# Patient Record
Sex: Female | Born: 1950 | Race: White | Hispanic: Yes | Marital: Single
Health system: Southern US, Community
[De-identification: ages and names within clinical notes are randomized; demographics above are authoritative.]

## PROBLEM LIST (undated history)

## (undated) DIAGNOSIS — J302 Other seasonal allergic rhinitis: Secondary | ICD-10-CM

## (undated) DIAGNOSIS — I10 Essential (primary) hypertension: Secondary | ICD-10-CM

## (undated) DIAGNOSIS — J45909 Unspecified asthma, uncomplicated: Secondary | ICD-10-CM

## (undated) HISTORY — DX: Unspecified asthma, uncomplicated: J45.909

## (undated) HISTORY — DX: Essential (primary) hypertension: I10

## (undated) HISTORY — DX: Other seasonal allergic rhinitis: J30.2

---

## 2000-06-01 HISTORY — PX: VAGINAL HYSTERECTOMY: SUR661

## 2017-03-16 ENCOUNTER — Ambulatory Visit (INDEPENDENT_AMBULATORY_CARE_PROVIDER_SITE_OTHER): Payer: Medicare Other | Admitting: Pulmonary Disease

## 2017-03-16 ENCOUNTER — Encounter: Payer: Self-pay | Admitting: Pulmonary Disease

## 2017-03-16 ENCOUNTER — Ambulatory Visit (INDEPENDENT_AMBULATORY_CARE_PROVIDER_SITE_OTHER)
Admission: RE | Admit: 2017-03-16 | Discharge: 2017-03-16 | Disposition: A | Discharge status: Home / Self Care | Payer: Medicare Other | Source: Ambulatory Visit | Attending: Pulmonary Disease | Admitting: Pulmonary Disease

## 2017-03-16 VITALS — BP 126/68 | HR 76 | Ht 62.0 in | Wt 142.0 lb

## 2017-03-16 DIAGNOSIS — R062 Wheezing: Secondary | ICD-10-CM

## 2017-03-16 DIAGNOSIS — R0600 Dyspnea, unspecified: Secondary | ICD-10-CM

## 2017-03-16 LAB — NITRIC OXIDE: NITRIC OXIDE: 77

## 2017-03-16 MED ORDER — ALBUTEROL SULFATE HFA 108 (90 BASE) MCG/ACT IN AERS: 2.0000 | INHALATION_SPRAY | Freq: Four times a day (QID) | RESPIRATORY_TRACT | 6 refills | Status: AC | PRN

## 2017-03-16 NOTE — Addendum Note (Signed)
Addended by: Velvet Bathe on: 03/16/2017 11:53 AM   Modules accepted: Orders

## 2017-03-16 NOTE — Addendum Note (Signed)
Addended by: Velvet Bathe on: 03/16/2017 11:45 AM   Modules accepted: Orders

## 2017-03-16 NOTE — Progress Notes (Signed)
Subjective:    Patient ID: Gina Thomas, female    DOB: 1951-04-12, 66 y.o.   MRN: [id number]  Synopsis: Referred in 2018 for evaluation of recurrent bronchitis and asthma.  HPI Chief Complaint  Patient presents with  . Advice Only    self referral for bronchitis, asthma.  pt recently relocated from [facility].     Gina Thomas is here to see me.  She says that she has had "hay fever" her whole life since childhood.  She doesn't know if she had asthma back then.  Never hospitalized for a respiratory problem.  She says that this got better as she aged however.  9 years ago she had to go to Urgent care because she had a bad cold that turned into bronchitis and then later she had asthma afterwards.  She was treated with albuterol, a cough medicine and prednisone initially.  She said that she was fine after that for 9 years until January 2018.  In January she had similar symptoms with a cold, acute bronchitis.  She had shortness of breath, cough, and wheezing.  She had a dry cough then.  She went to urgent care and took albuterol alone (no prednisone) and she got better over 6 weeks.    From 1977 to 1995 her husband smoked quite a bit.    On December 30, 2016 she moved to [place] from [place].  1 week after arrival she "got super sick": coughing, wheezing, shortness of breath.  In the beginning of September she went to Urgent Care and was seen by an NP and was Rx'd albuterol, a cough syrup, azithromycin, and prednisone  daily for 5 days.   She says that with this regimen she only slightly improved but she continues to struggle with coughing, wheezing and shortness of breath.  Her symptoms are periodic and she will have periods of normalcy.  Since her arrival here she doesn't have sinus congestion or itchy eyes.  She coughs up clear phlegm.  No heartburn or indigestion.    She has worked in a clerical environment over the years.  She now lives in a home she is renting, they have a cat which  they've had for 10 years.  The home has carpet, the HVAC system appears dirty.  She doesn't know if the house has mold.    She denies a scratchy throat, no trouble with her throat.     Past Medical History:  Diagnosis Date  . Asthma   . Hypertension   . Seasonal allergies      History reviewed. No pertinent family history.   Social History   Social History  . Marital status: Single    Spouse name: N/A  . Number of children: N/A  . Years of education: N/A   Occupational History  . Not on file.   Social History Main Topics  . Smoking status: Passive Smoke Exposure - Never Smoker  . Smokeless tobacco: Never Used     Comment: husband smoked in home from 1977-1995  . Alcohol use No  . Drug use: Unknown  . Sexual activity: Not on file   Other Topics Concern  . Not on file   Social History Narrative  . No narrative on file     No Known Allergies   No outpatient prescriptions prior to visit.   No facility-administered medications prior to visit.       Review of Systems  Constitutional: Negative for fever and unexpected weight change.  HENT:  Negative for congestion, dental problem, ear pain, nosebleeds, postnasal drip, rhinorrhea, sinus pressure, sneezing, sore throat and trouble swallowing.   Eyes: Negative for redness and itching.  Respiratory: Positive for cough, chest tightness, shortness of breath and wheezing.   Cardiovascular: Negative for palpitations and leg swelling.  Gastrointestinal: Negative for nausea and vomiting.  Genitourinary: Negative for dysuria.  Musculoskeletal: Negative for joint swelling.  Skin: Negative for rash.  Neurological: Negative for headaches.  Hematological: Does not bruise/bleed easily.  Psychiatric/Behavioral: Negative for dysphoric mood. The patient is not nervous/anxious.        Objective:   Physical Exam Vitals:   03/16/17 1055  BP: 126/68  Pulse: 76  SpO2: 97%  Weight: 142 lb (64.4 kg)  Height:  (1.575 m)    Gen: well appearing, no acute distress HENT: NCAT, OP clear, neck supple without masses Eyes: PERRL, EOMi Lymph: no cervical lymphadenopathy PULM: Wheezing bilaterally B CV: RRR, no mgr, no JVD GI: BS+, soft, nontender, no hsm Derm: no rash or skin breakdown MSK: normal bulk and tone Neuro: A&Ox4, CN II-XII intact, strength 5/5 in all 4 extremities Psyche: normal mood and affect        Assessment & Plan:   Dyspnea, unspecified type - Plan: DG Chest 2 View, Spirometry with Graph, Nitric oxide  Wheezing  Discussion: He describes several weeks of chest tightness, wheezing, and shortness of breath. On physical exam she has a significant amount of wheezing today. Given the intermittent episodes she has experienced over the years and the persistence of these symptoms since relocating to our area I believe that she has allergic asthma. To confirm this I would like to get spirometry testing, exhaled nitric oxide testing and a chest x-ray today. In terms of controller medicines she is taking Singulair but she still has poorly controlled asthma symptoms so I have advised at length for her to consider an inhaled corticosteroid. However, it's clear to me that she has a predetermined bias against these medicines and there is nothing I can do to convince her to take it. I actually tried for quite some time to convince her to try to take them and counseled her on the relative low risk of side effects from inhaled corticosteroids but she still refuses to take them. Given this I think is very reasonable to refer her to an allergist to see if there is an allergy that can be treated with immunotherapy.  Plan: For wheezing and shortness of breath: I believe that you have asthma We will arrange for a spirometry test, exhaled nitric oxide test and a chest X-ray Keep taking singulair Use albuterol 2 puffs every 4-6 hours as needed for chest tightness, wheezing or shortness of breath As you are not  comfortable taking an inhaled corticosteroid and I don't think that cromolyn is a very good medicine, I'd like for you to see an allergist to assess for immunotherapy  We will see you back in 3-4 weeks with an NP or sooner if needed    Current Outpatient Prescriptions:  .  Alpha-Lipoic Acid 200 MG CAPS, Take 1 capsule by mouth daily., Disp: , Rfl:  .  Ascorbic Acid (VITAMIN C) 1000 MG tablet, Take 1,000 mg by mouth daily., Disp: , Rfl:  .  Cinnamon 500 MG capsule, Take 500 mg by mouth daily., Disp: , Rfl:  .  hydrochlorothiazide (MICROZIDE) 12.5 MG capsule, Take 12.5 mg by mouth daily., Disp: , Rfl:  .  Lecithin 1200 MG CAPS, Take 2 capsules  by mouth daily., Disp: , Rfl:  .  losartan (COZAAR) 50 MG tablet, Take 50 mg by mouth daily., Disp: , Rfl:  .  Misc Natural Products (OSTEO BI-FLEX/5-LOXIN ADVANCED PO), Take 1 capsule by mouth daily., Disp: , Rfl:  .  montelukast (SINGULAIR) 10 MG tablet, Take 10 mg by mouth at bedtime., Disp: , Rfl:  .  Multiple Vitamin (MULTIVITAMIN WITH MINERALS) TABS tablet, Take 1 tablet by mouth daily., Disp: , Rfl:  .  NON FORMULARY, Take 1,500 mg by mouth daily. Kyolic Aged Garlic Extract, Disp: , Rfl:  .  Omega 3-6-9 Fatty Acids (OMEGA 3-6-9 COMPLEX PO), Take 1,600 mg by mouth daily., Disp: , Rfl:  .  rosuvastatin (CRESTOR) 10 MG tablet, Take 10 mg by mouth daily. Alternates 1 tab and 0.5 tab daily., Disp: , Rfl:  .  Turmeric 500 MG CAPS, Take 1 capsule by mouth daily., Disp: , Rfl:  .  Ubiquinol 200 MG CAPS, Take 1 capsule by mouth daily., Disp: , Rfl:

## 2017-03-16 NOTE — Progress Notes (Signed)
   Subjective:    Patient ID: Gina Thomas, female    DOB: 03-25-1951, 66 y.o.   MRN: [id number]  HPI    Review of Systems     Objective:   Physical Exam        Assessment & Plan:

## 2017-03-16 NOTE — Patient Instructions (Signed)
For wheezing and shortness of breath: I believe that you have asthma We will arrange for a spirometry test, exhaled nitric oxide test and a chest X-ray Keep taking singulair Use albuterol 2 puffs every 4-6 hours as needed for chest tightness, wheezing or shortness of breath As you are not comfortable taking an inhaled corticosteroid and I don't think that cromolyn is a very good medicine, I'd like for you to see an allergist to assess for immunotherapy  We will see you back in 3-4 weeks with an NP or sooner if needed

## 2017-03-29 ENCOUNTER — Telehealth: Payer: Self-pay | Admitting: Pulmonary Disease

## 2017-03-29 MED ORDER — BECLOMETHASONE DIPROPIONATE 80 MCG/ACT IN AERS: 2.0000 | INHALATION_SPRAY | Freq: Two times a day (BID) | RESPIRATORY_TRACT | 6 refills | Status: DC

## 2017-03-29 MED ORDER — FLUTICASONE FUROATE 100 MCG/ACT IN AEPB: 1.0000 | INHALATION_SPRAY | Freq: Every day | RESPIRATORY_TRACT | 5 refills | Status: AC

## 2017-03-29 NOTE — Telephone Encounter (Signed)
Pt is aware of BQ's recommendations and voiced her understanding. Rx has been sent to preferred pharmacy. Nothing further needed.  

## 2017-03-29 NOTE — Telephone Encounter (Signed)
Per BQ: Please send in Arnuity 100.    Spoke with pt, aware of rx change.  rx sent to preferred pharmacy.  Med list has been updated to reflect change.  Nothing further needed.

## 2017-03-29 NOTE — Telephone Encounter (Signed)
Spoke with pt. States that she is now wanting to take an inhaler. At her last OV, she was refusing any type of "steroid" inhaler. Reports that she has been having increased symptoms with only using Albuterol HFA.  BQ - please advise. Thanks.

## 2017-03-29 NOTE — Telephone Encounter (Signed)
Arnuity 1 puff daily

## 2017-03-29 NOTE — Telephone Encounter (Signed)
Pt states Qvar is not covered by insurance, preferred medications are Arnuity and Flovent.  BQ please advise. Thanks.

## 2017-03-29 NOTE — Telephone Encounter (Signed)
Can you clarify dose?

## 2017-03-29 NOTE — Telephone Encounter (Signed)
Pt called back about the status of this message..pt states she is coughing more, pt contact # [phone]

## 2017-03-29 NOTE — Telephone Encounter (Signed)
QVar 80mcg 2 puff bid 

## 2017-04-04 ENCOUNTER — Encounter: Payer: Self-pay | Admitting: Pulmonary Disease

## 2017-04-05 ENCOUNTER — Telehealth: Payer: Self-pay | Admitting: Pulmonary Disease

## 2017-04-05 NOTE — Telephone Encounter (Signed)
This appointment has been rescheduled.  It was cancelled by the automated reminder system but this has been rescheduled and patient is aware.

## 2017-04-05 NOTE — Telephone Encounter (Signed)
lmtcb x1 for pt. 

## 2017-04-05 NOTE — Telephone Encounter (Signed)
Patient called back - she can be reached at [phone] -pr

## 2017-04-05 NOTE — Telephone Encounter (Signed)
Pt is aware of BQ's recommendation's and voiced her understanding.  Nothing further needed.    

## 2017-04-05 NOTE — Telephone Encounter (Signed)
Hold Arnuity until tomorrow's appointment Use ventolin prn Keep appointment tomorrow

## 2017-04-05 NOTE — Telephone Encounter (Signed)
Pt was prescribed Arnuity 100 on 03/29/17. Pt states the first two day she took this medication she did not develop any symptoms. Pt states last night, minutes after she took Arnuity she was unable to caught her breath. Pt states symptoms subsided after using Ventolin. Pt is wanting to know if bronchodilator dries up mucus or if Flovent would have the same side effects.  Pt is scheduled for OV with TP tomorrow at 10:45.  BQ please advise. Thanks.

## 2017-04-06 ENCOUNTER — Ambulatory Visit: Payer: Medicare Other | Admitting: Adult Health

## 2017-04-06 ENCOUNTER — Other Ambulatory Visit: Payer: Self-pay | Admitting: Pulmonary Disease

## 2017-04-06 ENCOUNTER — Ambulatory Visit (INDEPENDENT_AMBULATORY_CARE_PROVIDER_SITE_OTHER): Payer: Medicare Other | Admitting: Pulmonary Disease

## 2017-04-06 ENCOUNTER — Encounter: Payer: Self-pay | Admitting: Adult Health

## 2017-04-06 VITALS — BP 106/64 | HR 72 | Ht 62.0 in | Wt 143.4 lb

## 2017-04-06 DIAGNOSIS — R059 Cough, unspecified: Secondary | ICD-10-CM

## 2017-04-06 DIAGNOSIS — R0600 Dyspnea, unspecified: Secondary | ICD-10-CM

## 2017-04-06 DIAGNOSIS — R05 Cough: Secondary | ICD-10-CM | POA: Diagnosis not present

## 2017-04-06 DIAGNOSIS — R0602 Shortness of breath: Secondary | ICD-10-CM

## 2017-04-06 DIAGNOSIS — J4540 Moderate persistent asthma, uncomplicated: Secondary | ICD-10-CM

## 2017-04-06 LAB — PULMONARY FUNCTION TEST
FEF 25-75 PRE: 1 L/s
FEF2575-%Pred-Pre: 50 %
FEV1-%PRED-PRE: 53 %
FEV1-Pre: 1.18 L
FEV1FVC-%PRED-PRE: 102 %
FEV6-%Pred-Pre: 53 %
FEV6-Pre: 1.49 L
FEV6FVC-%PRED-PRE: 104 %
FVC-%Pred-Pre: 51 %
FVC-Pre: 1.49 L
PRE FEV1/FVC RATIO: 79 %
Pre FEV6/FVC Ratio: 100 %

## 2017-04-06 MED ORDER — PREDNISONE 10 MG PO TABS: ORAL_TABLET | ORAL | 0 refills | Status: AC

## 2017-04-06 MED ORDER — FLUTICASONE PROPIONATE HFA 110 MCG/ACT IN AERO: 2.0000 | INHALATION_SPRAY | Freq: Two times a day (BID) | RESPIRATORY_TRACT | 3 refills | Status: AC

## 2017-04-06 NOTE — Patient Instructions (Signed)
Begin Flovent 2 puffs Twice daily  , rinse after use.  Prednisone taper over next week  Mucinex DM Twice daily  As needed  Cough/congestion .  Establish with Pulmonary MD once [place] are [place]  Please contact office for sooner follow up if symptoms do not improve or worsen or seek emergency care

## 2017-04-06 NOTE — Progress Notes (Signed)
 @Patient  ID: Gina Thomas, female    DOB: 02/17/1951, 66 y.o.   MRN: [id number]  Chief Complaint  Patient presents with  . Follow-up    Referring provider: No ref. provider found  HPI: 66 yo female seen for pulmonary consult 03/2017 for dyspnea and suspected asthma   TEST  FENO 77 03/2017   04/06/2017 Follow up : Asthma  Pt returns for 3 week follow up . She was seen 03/16/17 for pulmonary consult for dyspnea and suspected. Asthma. Says she had some allergies and hay fever over the years but moved from [place] to [place] in August and since then has had trouble with cough , wheezing , chest tightness , and dyspnea. She was recommended to begin QVAR but insurance would not cover. She was started on ARNUITY last ov . Her exhaled nitric oxide test was high at 77. CXR showed bronchitic changes with coarse interstitial markings .  Spirometry today showed moderate restriction with no significant obstruction . She says she was not able to tolerate ARNUITY . It caused her to cough more. Felt the powder aggravated her throat. She continues to have some cough and intermittent wheezing . She is moving back to [place] next week. Denies chest pain , orthopnea, edema or fever.  Insurance will not cover QVAR .  Did not tolerate ARNUNITY . - made her worse. Powder did not like it.    No Known Allergies   There is no immunization history on file for this patient.  Past Medical History:  Diagnosis Date  . Asthma   . Hypertension   . Seasonal allergies     Tobacco History: Social History   Tobacco Use  Smoking Status Passive Smoke Exposure - Never Smoker  Smokeless Tobacco Never Used  Tobacco Comment   husband smoked in home from 1977-1995   Counseling given: Not Answered Comment: husband smoked in home from 1977-1995   Outpatient Encounter Medications as of 04/06/2017  Medication Sig  . albuterol (PROVENTIL HFA;VENTOLIN HFA) 108 (90 Base) MCG/ACT inhaler Inhale 2 puffs into the  lungs every 6 (six) hours as needed for wheezing or shortness of breath.  . Alpha-Lipoic Acid 200 MG CAPS Take 1 capsule by mouth daily.  . Ascorbic Acid (VITAMIN C) 1000 MG tablet Take 1,000 mg by mouth daily.  . Cinnamon 500 MG capsule Take 500 mg by mouth daily.  . Fluticasone Furoate (ARNUITY ELLIPTA) 100 MCG/ACT AEPB Inhale 1 puff into the lungs daily.  . hydrochlorothiazide (MICROZIDE) 12.5 MG capsule Take 12.5 mg by mouth daily.  . Lecithin 1200 MG CAPS Take 2 capsules by mouth daily.  Marland Kitchen. losartan (COZAAR) 50 MG tablet Take 50 mg by mouth daily.  . Misc Natural Products (OSTEO BI-FLEX/5-LOXIN ADVANCED PO) Take 1 capsule by mouth daily.  . montelukast (SINGULAIR) 10 MG tablet Take 10 mg by mouth at bedtime.  . Multiple Vitamin (MULTIVITAMIN WITH MINERALS) TABS tablet Take 1 tablet by mouth daily.  . NON FORMULARY Take 1,500 mg by mouth daily. Kyolic Aged Garlic Extract  . Omega 3-6-9 Fatty Acids (OMEGA 3-6-9 COMPLEX PO) Take 1,600 mg by mouth daily.  . rosuvastatin (CRESTOR) 10 MG tablet Take 10 mg by mouth daily. Alternates 1 tab and 0.5 tab daily.  . Turmeric 500 MG CAPS Take 1 capsule by mouth daily.  Marland Kitchen. Ubiquinol 200 MG CAPS Take 1 capsule by mouth daily.   No facility-administered encounter medications on file as of 04/06/2017.      Review of Systems  Constitutional:   No  weight loss, night sweats,  Fevers, chills, fatigue, or  lassitude.  HEENT:   No headaches,  Difficulty swallowing,  Tooth/dental problems, or  Sore throat,                No sneezing, itching, ear ache,  +nasal congestion, post nasal drip,   CV:  No chest pain,  Orthopnea, PND, swelling in lower extremities, anasarca, dizziness, palpitations, syncope.   GI  No heartburn, indigestion, abdominal pain, nausea, vomiting, diarrhea, change in bowel habits, loss of appetite, bloody stools.   Resp: .  No chest wall deformity  Skin: no rash or lesions.  GU: no dysuria, change in color of urine, no urgency or  frequency.  No flank pain, no hematuria   MS:  No joint pain or swelling.  No decreased range of motion.  No back pain.    Physical Exam    GEN: A/Ox3; pleasant , NAD, elderly    HEENT:  [place]/AT,  EACs-clear, TMs-wnl, NOSE-clear, THROAT-clear, no lesions, no postnasal drip or exudate noted.   NECK:  Supple w/ fair ROM; no JVD; normal carotid impulses w/o bruits; no thyromegaly or nodules palpated; no lymphadenopathy.    RESP  Few trace exp wheezes ,. no accessory muscle use, no dullness to percussion  CARD:  RRR, no m/r/g, no peripheral edema, pulses intact, no cyanosis or clubbing.  GI:   Soft & nt; nml bowel sounds; no organomegaly or masses detected.   Musco: Warm bil, no deformities or joint swelling noted.   Neuro: alert, no focal deficits noted.    Skin: Warm, no lesions or rashes    Lab Results:  CBC No results found for: WBC, RBC, HGB, HCT, PLT, MCV, MCH, MCHC, RDW, LYMPHSABS, MONOABS, EOSABS, BASOSABS  BMET No results found for: NA, K, CL, CO2, GLUCOSE, BUN, CREATININE, CALCIUM, GFRNONAA, GFRAA  BNP No results found for: BNP  ProBNP No results found for: PROBNP  Imaging: Dg Chest 2 View  Result Date: 03/16/2017 CLINICAL DATA:  Cough, shortness of breath, and wheezing for the past 2 months. History of asthma, passive smoke exposure. EXAM: CHEST  2 VIEW COMPARISON:  None in PACs FINDINGS: The lungs are adequately inflated. The interstitial markings are coarse. There is no alveolar infiltrate or pleural effusion. The heart and pulmonary vascularity are normal. The bony thorax exhibits no acute abnormality. IMPRESSION: Prominence of the interstitial markings bilaterally compatible with the history of reactive airway disease. One cannot exclude superimposed acute bronchitis. There is no pneumonia nor CHF. Electronically Signed   By: David  [place]azil[place]Jordan M.D.   On: 03/16/2017 14:13     Assessment & Plan:   No problem-specific Assessment & Plan notes found for this  encounter.     Rubye Oaksammy Grainne Knights, NP 04/06/2017

## 2017-04-06 NOTE — Progress Notes (Signed)
Spirometry done today. 

## 2017-04-07 DIAGNOSIS — J45909 Unspecified asthma, uncomplicated: Secondary | ICD-10-CM | POA: Insufficient documentation

## 2017-04-07 DIAGNOSIS — R0600 Dyspnea, unspecified: Secondary | ICD-10-CM | POA: Insufficient documentation

## 2017-04-07 NOTE — Assessment & Plan Note (Signed)
Dyspnea suspect is secondary to poorly controlled Asthma CXR shows increased interstial markings and spirometry shows restriction  Check HRCT chest to r/o ILD

## 2017-04-07 NOTE — Assessment & Plan Note (Signed)
Moderate Persistent Asthma - uncontrolled Intolerant to ARNUITY . Would like to change to Symbicort but she is very resistant to meds .  She does agree to try Flovent which she says is covered by her insurance  Brief steroid taper x 1   Plan   Patient Instructions  Begin Flovent 2 puffs Twice daily  , rinse after use.  Prednisone taper over next week  Mucinex DM Twice daily  As needed  Cough/congestion .  Establish with Pulmonary MD once you are moved [place]  Please contact office for sooner follow up if symptoms do not improve or worsen or seek emergency care

## 2017-04-08 ENCOUNTER — Telehealth: Payer: Self-pay | Admitting: Pulmonary Disease

## 2017-04-08 NOTE — Telephone Encounter (Signed)
High dose OK

## 2017-04-08 NOTE — Telephone Encounter (Addendum)
BQ please advise if it is ok to recieve flu shot with her condition and advise whether it should be the high dose.

## 2017-04-08 NOTE — Telephone Encounter (Signed)
Called pt and advised message from the provider. Pt understood and verbalized understanding. Nothing further is needed.    

## 2017-04-08 NOTE — Progress Notes (Signed)
Reviewed, agree 

## 2017-04-08 NOTE — Telephone Encounter (Signed)
lmtcb x1 for pt. 

## 2017-04-08 NOTE — Telephone Encounter (Signed)
Patient returned call, CB is [phone].

## 2017-04-09 ENCOUNTER — Ambulatory Visit (INDEPENDENT_AMBULATORY_CARE_PROVIDER_SITE_OTHER)
Admission: RE | Admit: 2017-04-09 | Discharge: 2017-04-09 | Disposition: A | Discharge status: Home / Self Care | Payer: Medicare Other | Source: Ambulatory Visit | Attending: Adult Health | Admitting: Adult Health

## 2017-04-09 DIAGNOSIS — R059 Cough, unspecified: Secondary | ICD-10-CM

## 2017-04-09 DIAGNOSIS — R0602 Shortness of breath: Secondary | ICD-10-CM

## 2017-04-09 DIAGNOSIS — R05 Cough: Secondary | ICD-10-CM | POA: Diagnosis not present

## 2017-04-12 ENCOUNTER — Telehealth: Payer: Self-pay | Admitting: Pulmonary Disease

## 2017-04-12 NOTE — Telephone Encounter (Signed)
Notes recorded by Melvenia Needles, NP on 04/09/2017 at 5:36 PM EST CT chest some inflammatory changes in lung ? Chronic asthma changes vs reaction to something  Will need lab work for further evaluation - would be glad to set up for labs here  She is moving to [place] on 04/12/17 , will need to get copy of CT and records to establish with Pulmonary once in [place] for further evaluation if unable to complete here  If she wants to get labs : would get HSP hypersentivity panel , ANA, RA , ESR , IgE , RAST , and CBC w/ Diff.   Please contact office for sooner follow up if symptoms do not improve or worsen or seek emergency care     Advised pt of results. She wants to know if she can use her Flovent 1 puff twice a day?  She states she is doing much better and her last day of Prednisone is tomorrow. She states she will transfer her records to [place] once she sees a Technical brewer.   Give pt number for Medical records [phone], after TP answers.

## 2017-04-12 NOTE — Telephone Encounter (Signed)
Yes this was suppose to be sent to pharmacy  Flovent #1 2 puffs Twice daily  , 5 refills.   So glad to hear she is doing much better.   Is she getting labs here ?    Please contact office for sooner follow up if symptoms do not improve or worsen or seek emergency care

## 2017-04-12 NOTE — Telephone Encounter (Signed)
Called pt and advised message from the provider. Pt understood and verbalized understanding. Nothing further is needed.    

## 2018-09-03 IMAGING — CT CT CHEST HIGH RESOLUTION W/O CM
2 of 5 series · 15 of 36 positions shown, 18 images · non-contrast
Comparison: 03/16/2017 chest radiograph.

CLINICAL DATA: Cough, dyspnea.

EXAM:
CT CHEST WITHOUT CONTRAST
TECHNIQUE: Multidetector CT imaging of the chest was performed following the
standard protocol without intravenous contrast. High resolution
imaging of the lungs, as well as inspiratory and expiratory imaging,
was performed.

[Series 2: high resolution · axial · 0.68mm/px · z∈[-309,-33]mm · 12 of 152 slices shown, 15 images]
[im 7/152  mediastinal]
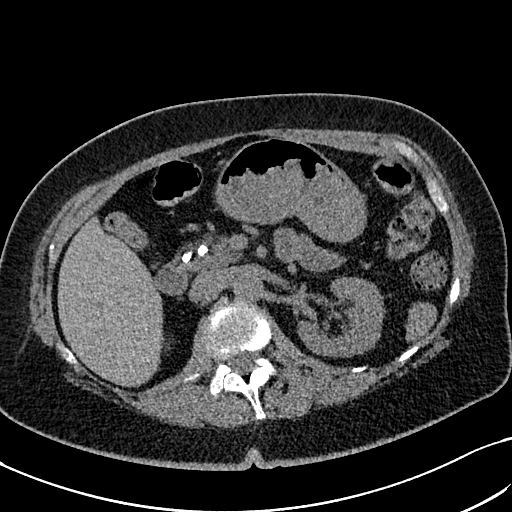
[im 7/152  lung]
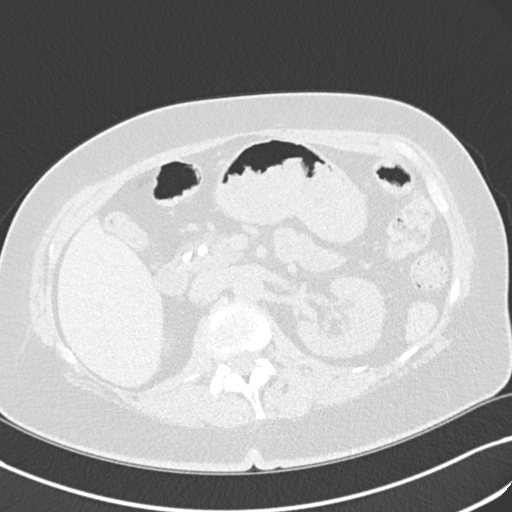
[im 20/152  lung]
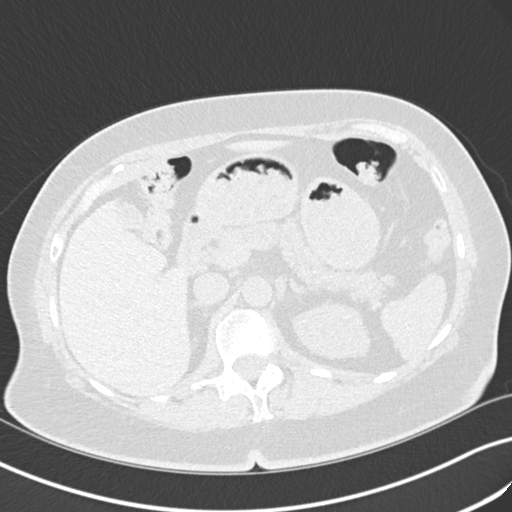
[im 33/152  lung]
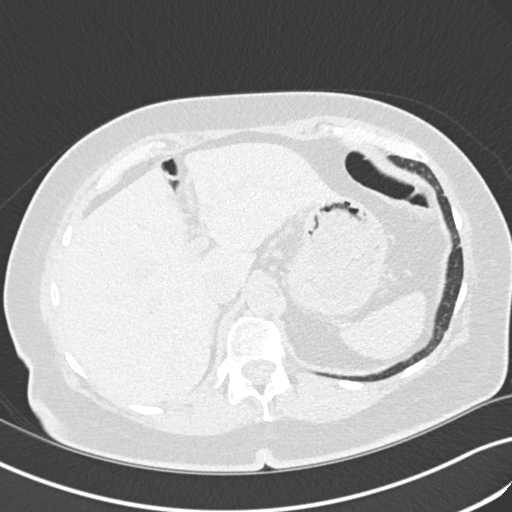
[im 46/152  lung]
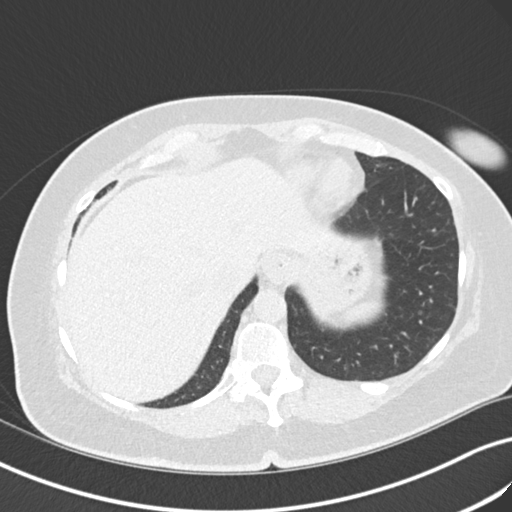
[im 60/152  mediastinal]
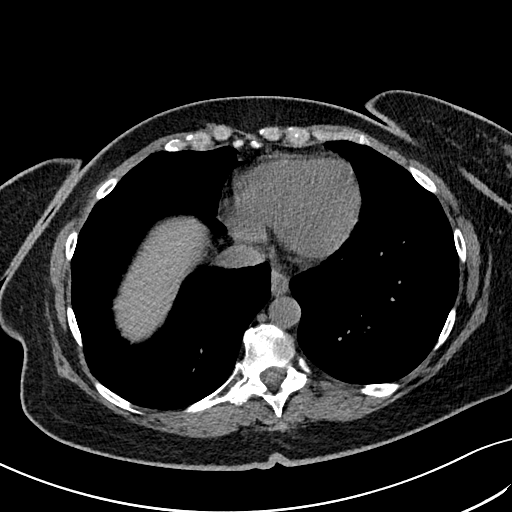
[im 60/152  lung]
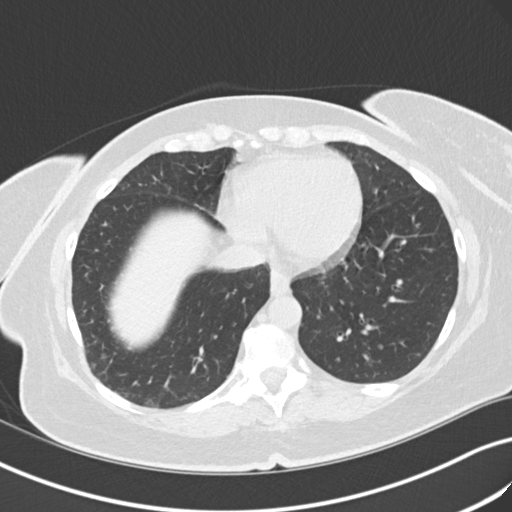
[im 73/152  lung]
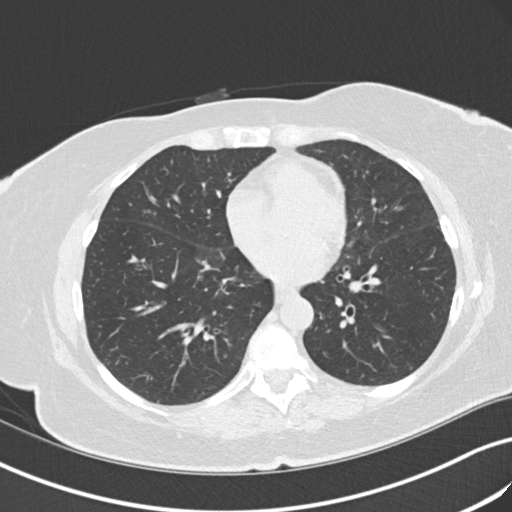
[im 79/152  lung]
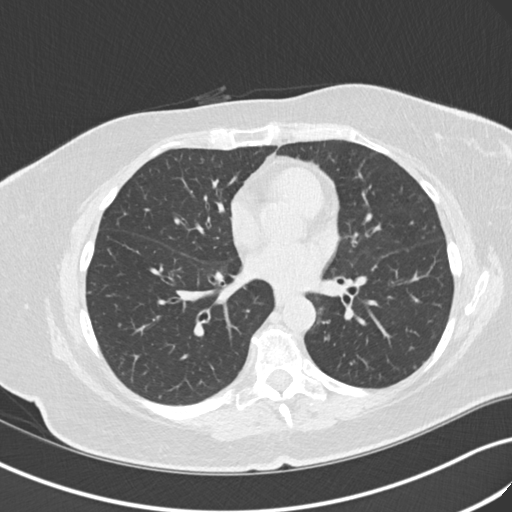
[im 92/152  lung]
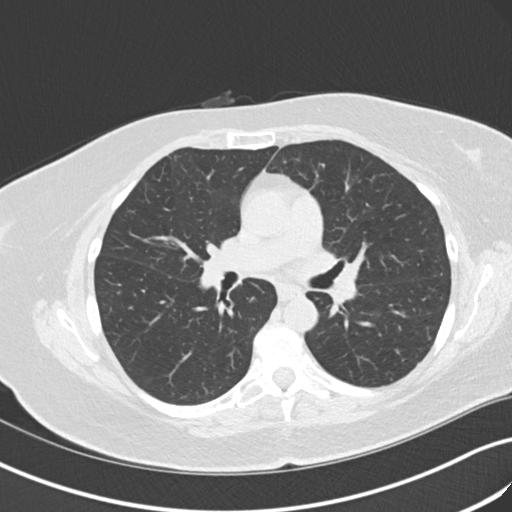
[im 106/152  mediastinal]
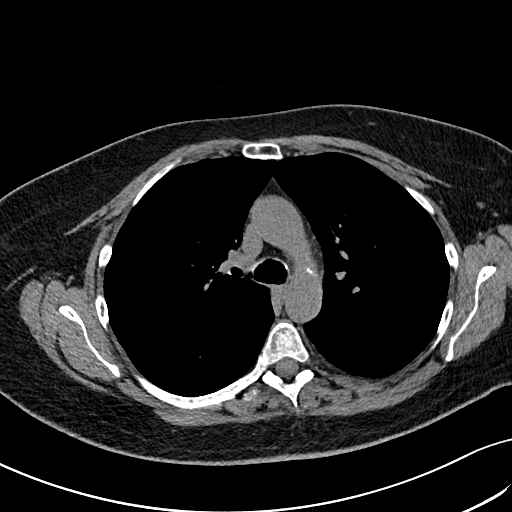
[im 106/152  lung]
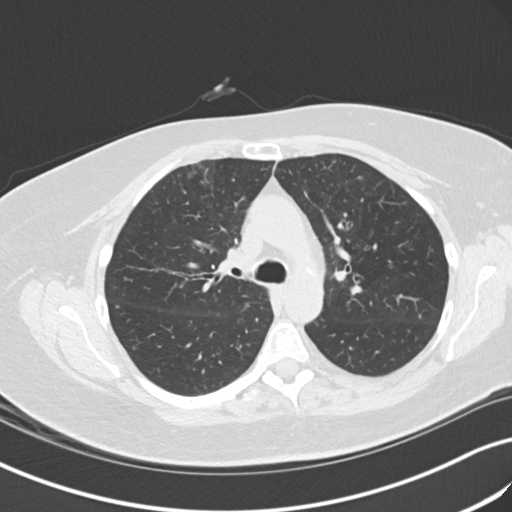
[im 119/152  lung]
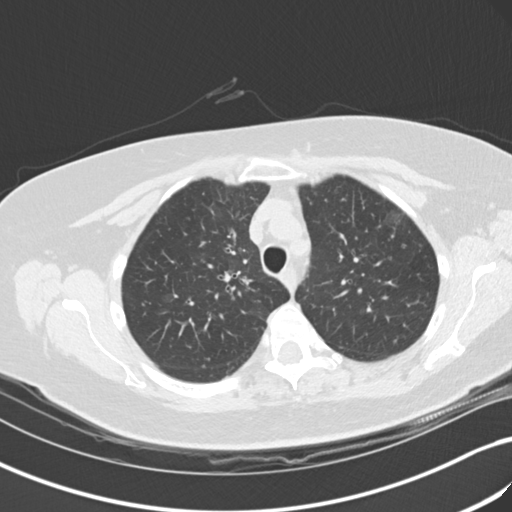
[im 132/152  lung]
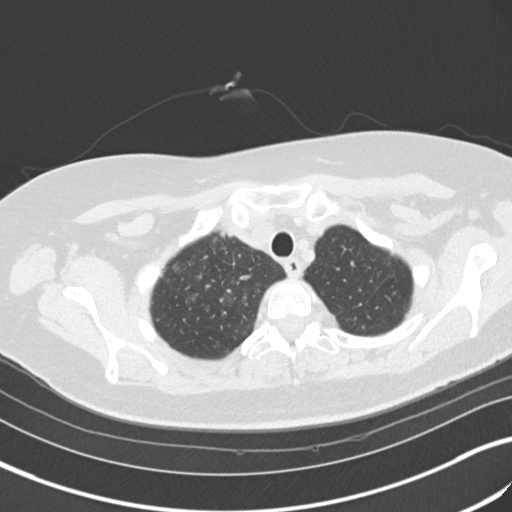
[im 145/152  lung]
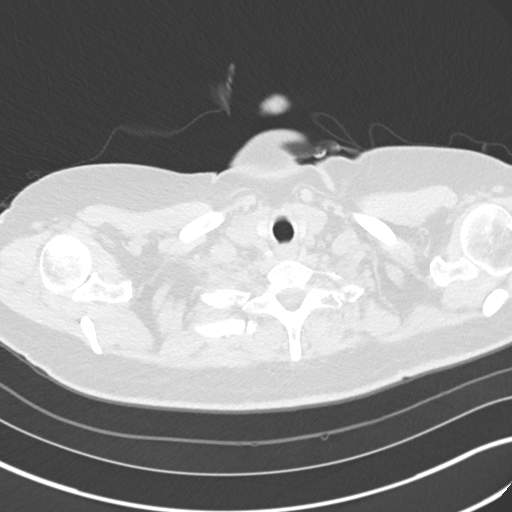

[Series 8: coronal · coronal · 0.63mm/px · 3 of 114 slices shown]
[im 23/114  lung]
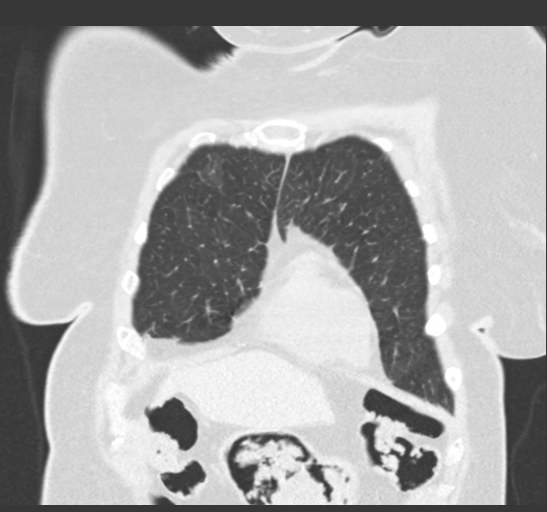
[im 46/114  lung]
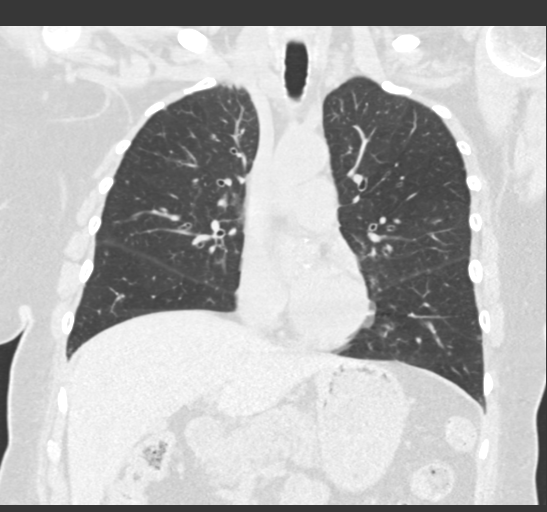
[im 68/114  lung]
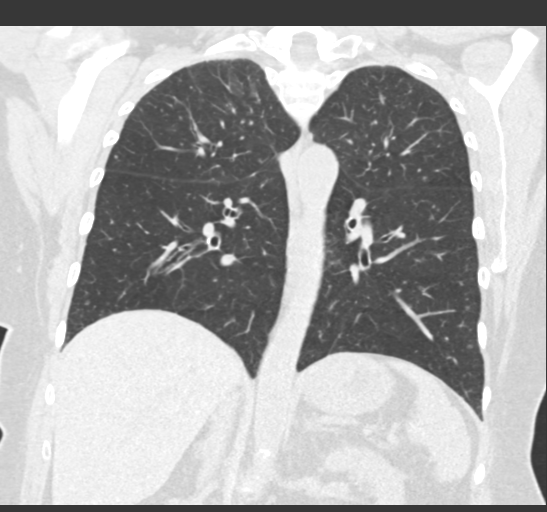

[15 of 36 positions shown; findings below may reference images not displayed]

FINDINGS: Cardiovascular: Normal heart size. No significant pericardial
fluid/thickening. Left anterior descending coronary atherosclerosis.
Atherosclerotic nonaneurysmal thoracic aorta. Normal caliber
pulmonary arteries.

Mediastinum/Nodes: No discrete thyroid nodules. Unremarkable
esophagus. No pathologically enlarged axillary, mediastinal or gross
hilar lymph nodes, noting limited sensitivity for the detection of
hilar adenopathy on this noncontrast study.

Lungs/Pleura: No pneumothorax. No pleural effusion. No acute
consolidative airspace disease or lung masses. There is patchy
centrilobular ground-glass micronodularity throughout both lungs.
There is mild patchy peribronchovascular and subpleural ground-glass
attenuation and reticulation throughout both lungs without a basilar
gradient. There is associated minimal architectural distortion in
the upper lobes. There is diffuse bronchial wall thickening. No
significant regions of traction bronchiectasis or frank
honeycombing. There is extensive patchy air trapping throughout both
lungs on the expiration sequence.

Upper abdomen: Coarse curvilinear calcification in the anterior
pancreatic head is nonspecific and benign appearing, potentially due
to chronic pancreatitis.

Musculoskeletal: No aggressive appearing focal osseous lesions. Mild
thoracic spondylosis.
IMPRESSION: 1. Spectrum of pulmonary parenchymal findings most compatible with
subacute hypersensitivity pneumonitis. Extensive patchy air
trapping. Patchy centrilobular ground-glass micronodularity. Patchy
peribronchovascular and subpleural ground-glass attenuation and
reticulation. Diffuse bronchial wall thickening.
2. One vessel coronary atherosclerosis.

Aortic Atherosclerosis ([id number]).
